# Patient Record
Sex: Male | Born: 2000 | Race: Black or African American | Hispanic: No | Marital: Single | Smoking: Never smoker
Health system: Southern US, Community
[De-identification: ages and names within clinical notes are randomized; demographics above are authoritative.]

---
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
## 2019-06-29 ENCOUNTER — Other Ambulatory Visit: Payer: Self-pay

## 2019-06-29 DIAGNOSIS — Z20822 Contact with and (suspected) exposure to covid-19: Secondary | ICD-10-CM

## 2019-07-01 LAB — NOVEL CORONAVIRUS, NAA: SARS-CoV-2, NAA: NOT DETECTED
